# Patient Record
Sex: Male | Born: 2005 | Race: White | Hispanic: Yes | Marital: Single
Health system: Southern US, Community
[De-identification: ages and names within clinical notes are randomized; demographics above are authoritative.]

## PROBLEM LIST (undated history)

## (undated) ENCOUNTER — Ambulatory Visit (HOSPITAL_COMMUNITY): Admission: EM | Payer: Self-pay | Source: Home / Self Care

## (undated) DIAGNOSIS — T7840XA Allergy, unspecified, initial encounter: Secondary | ICD-10-CM

## (undated) HISTORY — DX: Allergy, unspecified, initial encounter: T78.40XA

---
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
## 2025-05-14 ENCOUNTER — Ambulatory Visit: Admitting: Physician Assistant
